# Patient Record
Sex: Male | Born: 1995
Health system: Western US, Academic
[De-identification: ages and names within clinical notes are randomized; demographics above are authoritative.]

## PROBLEM LIST (undated history)

## (undated) DIAGNOSIS — S060XAA Concussion with loss of consciousness status unknown, initial encounter: Secondary | ICD-10-CM

## (undated) DIAGNOSIS — S060X9A Concussion with loss of consciousness of unspecified duration, initial encounter: Secondary | ICD-10-CM

## (undated) DIAGNOSIS — S62101A Fracture of unspecified carpal bone, right wrist, initial encounter for closed fracture: Secondary | ICD-10-CM

## (undated) HISTORY — PX: TYMPANOSTOMY TUBE PLACEMENT: SHX32

## (undated) HISTORY — DX: Concussion with loss of consciousness of unspecified duration, initial encounter: S06.0X9A

## (undated) HISTORY — PX: WRIST SURGERY: SHX841

## (undated) HISTORY — DX: Concussion with loss of consciousness status unknown, initial encounter: S06.0XAA

## (undated) HISTORY — DX: Fracture of unspecified carpal bone, right wrist, initial encounter for closed fracture: S62.101A

---
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
## 2019-08-05 ENCOUNTER — Telehealth: Payer: Self-pay | Admitting: Unknown Physician Specialty

## 2019-08-05 NOTE — Telephone Encounter (Signed)
No one is on pt's DPR called pt no answer left vm to call back    Copied from CRM [id number]. Topic: General - Inquiry >> Aug 05, 2019 11:36 AM Leafy Ro wrote: Reason for CRM:  Pt mom is calling and would like a copy of her son immunization records. Please call mom when ready for pick up

## 2019-08-07 NOTE — Telephone Encounter (Signed)
Called PT to confirm due to mother not on DPR Pt verbalized and gave authorization for mother to pick up documentation Pt verbalized understanding.Immunization record  was printed and given sealed to mother in a envelope per pt verbal authorization.

## 2021-01-01 ENCOUNTER — Ambulatory Visit: Payer: PRIVATE HEALTH INSURANCE

## 2021-01-01 DIAGNOSIS — Z021 Encounter for pre-employment examination: Secondary | ICD-10-CM

## 2021-01-01 DIAGNOSIS — Z025 Encounter for examination for participation in sport: Secondary | ICD-10-CM

## 2021-01-01 DIAGNOSIS — Z136 Encounter for screening for cardiovascular disorders: Secondary | ICD-10-CM

## 2021-01-02 ENCOUNTER — Inpatient Hospital Stay: Payer: PRIVATE HEALTH INSURANCE | Attending: Pediatric Cardiology

## 2021-01-02 ENCOUNTER — Inpatient Hospital Stay: Payer: PRIVATE HEALTH INSURANCE

## 2021-01-02 ENCOUNTER — Ambulatory Visit: Payer: PRIVATE HEALTH INSURANCE

## 2021-01-02 ENCOUNTER — Ambulatory Visit: Payer: PRIVATE HEALTH INSURANCE | Attending: Sports Medicine

## 2021-01-02 DIAGNOSIS — Z136 Encounter for screening for cardiovascular disorders: Secondary | ICD-10-CM

## 2021-01-02 DIAGNOSIS — Z025 Encounter for examination for participation in sport: Secondary | ICD-10-CM

## 2021-01-02 DIAGNOSIS — Z021 Encounter for pre-employment examination: Secondary | ICD-10-CM

## 2021-01-02 NOTE — H&P
[facility] - Sports Medicine Encounter  [facility]  [place]          PATIENT:  Vincent Allison  MRN:  [id number]  DOB:  January 06, 1996  DATE OF SERVICE:  01/02/2021    No chief complaint on file.      REFERRING: [employer]     REASON FOR REFERRAL/CONSULTATION: Medical screening/Pre-participation Evaluation (PPE)       SUBJECTIVE:      Antion Andres is a 25 y.o. male who is here for a screening Sports PPE.  He is here for a [employer].  All prior PPE's have been normal.  He has never been disqualified from sports participation.    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 Previously played 4  Years at [employer].    Had COVID 2020 - mild symptoms  J&J: 08/05/19  Pfizer booster: 03/03/20    Has chipped left upper molar - was evaluated by dentist who did not feel any intervention was required.      Sport: Biochemist, clinical  He feels well and has no complaints.    Past Medical History: No past medical history on file.     Past Surgical History: No past surgical history on file.    Current Medications:   No current outpatient medications on file.     No current facility-administered medications for this visit.       Allergies: Patient has no allergy information on record.    Family History:   No family status information on file.     No family history on file.  No known family history of sudden cardiac or unexplained death, structural heart disease (hypertrophic or dilated cardiomyopathy, Marfan syndrome or connective tissue disease, long QT syndrome or other channelopathies and clinically important arrhythmias.    Social History  Tobacco/alcohol/drugs: denies   Sexual activity: discussed   Diet: No restrictions   Exercise: Daily   Sleep: Without issue   Emotional well being: no concerns, denies SI/HI, negative depression screen   Safety: Uses appropriate protective equipment. Wears seat belt in vehicles.     Supplements   Denies OTC herbal supplements, stimulants, or steroids.    Review of Systems   General: negative for chills, fatigue, fever, malaise, weight gain and weight loss   CVS: negative for chest pain, dyspnea on exertion, loss of consciousness, murmur, orthopnea, palpitations, shortness of breath, presyncope and previous diagnosis of hypertension   Respiratory: negative for cough, pleuritic pain, shortness of breath, wheezing and previous diagnosis of asthma   GU: negative for hernias and pelvic pain   Neurologic: negative for confusion, dizziness, gait disturbance, headaches, impaired coordination/balance, numbness/tingling, seizures, speech problems, visual changes and recent or previous concussions   Musculoskeletal: negative for joint pain, joint swelling, muscle pain, muscular weakness, chronic muscle or joint pain and recent fractures or dislocations   Additional review of 10 systems was otherwise negative (See NBA player questionnaire).      OBJECTIVE:      BP 115/60  ~ Pulse (!) 48  ~ Temp 36.4 ?C (97.5 ?F)  ~ Ht 6' 6.5'' (1.994 m)  ~ Wt (!) 223 lb (101.2 kg)  ~ BMI 25.44 kg/m?  Body mass index is 25.44 kg/m?Marland Kitchen       Visual Acuity Screening    Right eye Left eye Both eyes   Without correction:      With correction: 20//20 20/20 20/15  General appearance: alert, appears stated age, NAD  Head:  normocephalic, atraumatic, without obvious abnormality  Eyes:  lids and conjunctiva grossly normal. PERRL, EOMI  ENMT: external ears without abnormality, dentition good, MMM  Neck: midline trachea, no rigidity, no thyromegaly, no LAD  Cardiovascular;  RRR, normal s1 and s2, no m/r/g.  Respiratory: CTABL, good respiratory effort, no w/r/r  GI:  BS+, soft, NTND, no masses or organomegaly.  Musculoskeletal exam: Normal range of motion. He exhibits no edema, extremities WWP  Neurological exam:  CN II-XII grossly intact, sensation to light touch intact, strength 5/5 in BUE and BLE, finger-to-nose and rapid alternating movements intact, ambulating without difficulty  Skin: skin color grossly normal, no rashes or lesions on inspection or palpation   Psychiatric:  A&O, mood and affect appropriate       ASSESSMENT:    Chanceler Pullin is a 25 y.o. male who presents for preparticipation physical evaluation and clearance.     In addition to this note, I have reviewed the [facility] pre-participation questionnaire which was completed by Jones Skene.  Pertinent positives have been noted.    Ryelan Kazee is found to be in excellent medical health.   PLAN:    1. Sports physical  2. Physical exam, pre-employment    [employer]BA [employer] Sports Physical - Labs ordered:   - Lipid Panel  - Hgba1c  - CBC  - Ferritin  - CMP  - GGT  - Uric Acid  - Vitamin D, 25-Hydroxy  - TSH  - MTB-Quantiferon-Gold ELISA  - Sickle Cell Prep  - Hep B sAg  - Hep B sAb-Quant  - Hep B core Igm  - HCV screen  - Measles titer  - Mumps titer  - Rubella titer  - Varicella titer  - Urinalysis  - Phosphorus    Concussion Baseline test completed.  See scanned document in [facility].    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      Roetta Sessions. Angelena Sole, MD  Primary Care Sports Medicine  Division of Sports Medicine & Non-Operative Orthopedics  [facility]  Associate Head Team Physician and Director of Primary Care - [facility] [place]

## 2021-01-02 NOTE — Consults
PATIENT: Vincent Allison  MRN: [id number]  DOB: 01/05/1996  DATE OF SERVICE: 01/02/2021    REFERRING: [facility] [employer]  PRIMARY CARE PROVIDER: Dagoberto Reef M.D.    SPECIALTY: Cardiology    REASON FOR REFERRAL/CONSULTATION: Cardiac screening per [facility] protocol.    Subjective:   History was provided by the patient .  Vincent Allison is a 25 y.o. male who was seen in clinic for pre-participation cardiac screening. Screening form was negative for family history of cardiac disease or symptoms of cardiac disease.    The following items in the patient's chart were reviewed: allergies, current medications, past family history, past medical history, past social history, past surgical history and problem list. The patient states he has completed the NBA screening questionnaire and had no positive findings other than what is noted below. The cardiovascular questions were reviewed with the patient and he specifically denied syncope, near syncope, chest pain with exercise, palpitations, prior cardiac diagnosis or exercise induced symptoms. He also denied any family history of sudden death, unusual accidental deaths, early cardiovascular disease or diabetes.    Review of Systems:  Pertinent items are noted in HPI    Objective:     Vitals:  There were no vitals taken for this visit. No height and weight on file for this encounter. Facility age limit for growth percentiles is 20 years. Facility age limit for growth percentiles is 20 years.    General:   alert and no distress   Skin:   warm and dry, no hyperpigmentation, vitiligo, or suspicious lesions   Nodes:   cervical, supraclavicular, and axillary nodes normal.   Eyes:   No conjunctival injection, sclerae anicteric   Oropharynx:  lips, mucosa, and tongue normal; teeth and gums normal   Neck:  no adenopathy, no carotid bruit, no JVD, supple, symmetrical, trachea midline and thyroid not enlarged, symmetric, no tenderness/mass/nodules   Lungs:   clear to auscultation bilaterally   Heart: regular rate and rhythm, S1, S2 normal, no murmur, click, rub or gallop   Abdomen:   soft, non-tender; bowel sounds normal; no masses,  no organomegaly   Extremities:  extremities normal, atraumatic, no cyanosis or edema   Psychiatric:   normal mood, behavior, speech, dress, and thought processes     Stress Echocardiogram: Normal/ Pass    Echocardiogram: Normal/ Pass    ECG: Normal/ Pass    Assessment:     Healthy Athlete.    Plan/ Recommendation:       Cleared to participate in professional athletics.    Attending: Vania Rea. Carollee Herter 01/02/2021 2:54 PM       NBA Marfan Syndrome Focused Examination      Name:___________[name]________________________ DOB:_________[birth year]_________________  If potential incidence of Marfan Syndrome is suspected based on the results of the [facility] Cardiovascular Screening Medical History Questionnaire, and the player has not previously undergone the NBA Marfan Syndrome Focused Examination, the physician must evaluate the player for the criteria below and discuss the results with the [place] cardiac consultants designated at the bottom of this form.  The physician and player are not required to review or complete this focused examination if the player has previously completed it.  A. Marfan Criteria ?   Wrist AND/OR Thumb Sign1  []  Yes [x]  No   Pectus Carinatum or Excavatum Deformity []  Yes [x]  No   Scoliosis or Thoracolumbar Kyphosis []  Yes [x]  No   Reduced Elbow Extension []  Yes [x]  No   Skin Striae []  Yes [x]  No  Hindfoot Deformity []  Yes [x]  No   Plain Flat Foot []  Yes [x]  No   Facial Features22 []  Yes [x]  No   Severe Myopia (>3 diopters) []  Yes [x]  No   Spontaneous Pneumothorax []  Yes [x]  No  B. Additional Medical Notes by Physician (if needed)             Following this focused examination, the physician must discuss the results of this evaluation with a [employer] cardiac consultant: attn. Garth Bigness - [phone] (o) ~ [email or web address] ~ [phone] (m); or if Dr. Rennis Chris is unavailable: Newman Nickels - [phone] (o) ~ [email or web address] ~ [phone] (m).    ? Loeys BL, Reginia Forts HC, Braverman AC, et al; The revised [place] nosology for the Marfan Syndrome. J Med Genet. 2010 Jul;47(7):476-85. Doi: [id number]    1 Please be advised that the thumb sign is positive when the entire distal phalanx of the adducted thumb extends beyond the ulnar border of the palm with or without the assistance of the patient or examiner to achieve maximal adduction.  The wrist sign is positive when the tip of the thumb covers the entire fingernail of the fifth finger when wrapped around the contralateral wrist.    2 The physician should evaluate the player for these typical facial characteristics: dolichocephaly (disproportionately long and narrow head); downward slanting palpebral fissures (slanting of space between eyelids); enophthalmos (recession of eyeball within orbit); retrognathia (recession of either or both jaws); and malar hypoplasia (underdeveloped cheekbones).

## 2021-01-03 NOTE — Progress Notes
ORTHOPAEDIC SURGERY CONSULT NOTE    Vincent Allison   DOB: Nov 26, 1995   MRN: [id number]   Date of Service: 01/02/2021     CHIEF COMPLAINT:  Pre-participation physical    HPI: 25 y.o. 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 ***He denies any musculoskeletal complaints at this time.    ***He does report a history of *** injury in the past (month and year) which was treated with ***.    ***His time away from basketball competition in the past due to injury includes ***months following a ***    ***I have reviewed with the patient the [facility] pre-participation questionnaire which was completed by the patient.      PAST MEDICAL HISTORY:   No past medical history on file.    PAST SURGICAL HISTORY:  No past surgical history on file.    MEDICATIONS:   No current outpatient medications on file.     No current facility-administered medications for this visit.       ALLERGIES:  Patient has no allergy information on record.    PHYSICAL EXAM:   There were no vitals filed for this visit.  BMI: There is no height or weight on file to calculate BMI.  GENERAL: Well-developed, well nourished, no acute distress  SKIN: intact, no erythema or ecchymosis  SPINE: ***Full symmetric flexion, axial rotation and lateral bending of the lumbar and cervical spine without issue      KNEE EXAM:   Left  Right   Standing alignment {Neutral/varus:32217::''neutral''} {Neutral/varus:32217::''neutral''}   Flexion {NUMBERS; 0-180 BY 10:15935::''140''} {NUMBERS; 0-180 BY 10:15935::''140''}   Extension {Rom -50-20:140019::''0''} {Rom -50-20:140019::''0''}   Effusion {MILD, MOD, SEV:29846::''none''} {MILD, MOD, SEV:29846::''none''}       Special tests:   Left Right   Joint line Tenderness {Anatomy; malleolus lateral/medial:19558::''medial and lateral'',''none''}   {Anatomy; malleolus lateral/medial:19558::''medial and lateral'',''none''}     Patellar facet tenderness {ADVANCED DIRECTIVE NO:20005::''no''} {ADVANCED DIRECTIVE NO:20005::''no''}   McMurray's {pos/neg/not done:321853::''negative''} {pos/neg/not done:321853::''negative''}   Lachman {grade:317898::''guarded'',''stable''} {grade:317898::''guarded'',''stable''}   Posterior Drawer  (station of MFC) {grade:317898::''guarded'',''stable''} {grade:317898::''guarded'',''stable''}   Valgus stress at 0deg {grade:317898::''guarded'',''stable''} {grade:317898::''guarded'',''stable''}   Varus stress at 0deg {grade:317898::''guarded'',''stable''} {grade:317898::''guarded'',''stable''}   Valgus stress at 30deg {grade:317898::''guarded'',''stable''} {grade:317898::''guarded'',''stable''}   Varus stress at 30deg {grade:317898::''guarded'',''stable''} {grade:317898::''guarded'',''stable''}   Thessaly's {pos/neg/not done:321853::''negative''} {pos/neg/not done:321853::''negative''}       HIP EXAM:  ***Symmetric internal rotation and external rotation with hips flexed to 90 degrees   ***No pain with with abduction and external rotation of the bilateral hips  ***No irritability of hip with flexion, internal rotation and adduction at 90deg flexion bilaterally    Additional lower extremity tests:  ***He can squat like a baseball catcher and take steps in this position without pain  ***He can hop 5 times on each ankle without issue  ***Symmetric ankle dorsiflexion and plantar flexion with range of motion within normal limits bilaterally  ***No TTP at the medial or lateral malleolus  ***No pain with syndesmosis squeeze, hindfoot squeeze or forefoot squeeze  ***No TTP at ATFL, CFL, PTFL or deltoid ligaments  ***Symmetric anterior drawer testing of bilat ankles  ***No TTP at the base of the 5th metatarsal bilaterally  ***No TTP at the patellar tendons or quadriceps tendons bilatearally  ***incisions (***# and size?) well healed at the***    Neurovascular lower extremity exam:  Neurologic:               Motor: EHL, TA, G/S intact at the bilateral lower extremities  Sensory: Sensation to light touch intact throughout Sural, Saph, DPN, SPN and tibial nerve distributions at the bilateral lower extremities   Vascular: palpable dorsalis pedis pulse at the bilateral lower extremities         UPPER EXTREMITY EXAM:   Left Right   Spurlings {pos/neg/not done:321853::''negative''} {pos/neg/not done:321853::''negative''}   AC joint TTP {pos/neg/not done:321853::''negative''} {pos/neg/not done:321853::''negative''}   Biceps TTP {pos/neg/not done:321853::''negative''} {pos/neg/not done:321853::''negative''}   Neer {pos/neg/not done:321853::''negative''} {pos/neg/not done:321853::''negative''}   Hawkings {pos/neg/not done:321853::''negative''} {pos/neg/not done:321853::''negative''}       ROM:   Left Right   Forward flexion  AROM: {NUMBERS; 0-180 BY 10:15935::''175''}  PROM: {NUMBERS; 0-180 BY 10:15935::''175''} AROM: {NUMBERS; 0-180 BY 10:15935::''175''}  PROM: {NUMBERS; 0-180 BY 10:15935::''175''}   ER at 90 (PROM) {NUMBERS; 0-180 BY 10:15935::''90''} {NUMBERS; 0-180 BY 10:15935::''90''}   IR at 90 (PROM) {NUMBERS; 0-180 BY 10:15935::''20''} {NUMBERS; 0-180 BY 10:15935::''20''}   ER at 0 (PROM) {NUMBERS; 0-180 BY 10:15935::''30''} {NUMBERS; 0-180 BY 10:15935::''30''}     Rotator cuff:   Left Right   Deltoid {NUMBERS; 1-5 (OUT OF 5):31831::''5/5''} {NUMBERS; 1-5 (OUT OF 5):31831::''5/5''}   Supraspinatus strength (Jobe's/empty can) {NUMBERS; 1-5 (OUT OF 5):31831::''5/5''} {NUMBERS; 1-5 (OUT OF 5):31831::''5/5''}   Infraspinatus strength {NUMBERS; 1-5 (OUT OF 5):31831::''5/5''} {NUMBERS; 1-5 (OUT OF 5):31831::''5/5''}   Belly press {NUMBERS; 1-5 (OUT OF 5):31831::''5/5''} {NUMBERS; 1-5 (OUT OF 5):31831::''5/5''}     ***Neg anterior apprehension and posterior apprehension bilateral upper extremities  ***Full symmetric ROM and strength at bilateral elbows  ***Normal symmetric grip strength bilateral upper extremities  ***No angular or rotational deformities at all fingers of the bilateral hands    Neurovascular upper extremity exam:  Vascular: palpable 2+ radial pulse at the bilateral upper extremities  Neurologic:  Motor: Finger flexion, finger extension, thumb extension, interossei function intact and the patient is able to make OK symbol at the bilateral upper extremities  Sensory: sensation to light touch intact at the Radial, Ulnar, Median and Axillary nerve distributions at the bilateral upper extremities    IMAGING:  XR hip ap+lat bilat w pelvis (5 views) Result Date: 01/02/2021  XR HIP AP LAT BILAT W AP PELVIS 5V INDICATION: NBA Athlete, Routine sports physical exam COMPARISON: None. FINDINGS: No acute or healing fracture. The hips demonstrate anatomic alignment. The joint spaces are preserved. Bilaterally, there is mild focal broadening at the anterolateral femoral head-neck junction (mild cam-type morphology). No calcific tendinitis. Sacroiliac joints and pubic symphysis are maintained. The imaged spine is unremarkable. Nonobstructive bowel gas pattern.   IMPRESSION: No evidence of recent trauma or arthritis. I, Maryellen Pile, M.D., have reviewed this radiological study personally, and I am in full agreement with the findings of the report presented above. Signed by: Maryellen Pile   01/02/2021 9:26 AM    XR ankle ap+lat+mortise standing bilat (3 views ea) Result Date: 01/02/2021  XR ANKLE AP LAT MORTISE STANDING BILAT 3V INDICATION: NBA Athlete, Routine sports physical exam COMPARISON: None. FINDINGS: No acute or healing fracture on either side. The joint spaces are maintained, with normal alignment. No ankle joint effusion on either side. No significant soft tissue swelling.   IMPRESSION: No evidence of recent trauma or arthritis. I, Maryellen Pile, M.D., have reviewed this radiological study personally, and I am in full agreement with the findings of the report presented above. Signed by: Maryellen Pile   01/02/2021 9:22 AM    XR knee ap+lat+tunnel+merchant standing bilat (4 views ea) Result Date: 01/02/2021  XR KNEE AP LAT TUNNEL MERCHANT STANDING BILAT 4V INDICATION: NBA Athlete,  Routine sports physical exam COMPARISON: None. FINDINGS: No acute or healing fracture on either side. The spaces are maintained, with normal alignment bilaterally. No joint effusion or chondrocalcinosis.   IMPRESSION: Unremarkable knee radiographs. I, Maryellen Pile, M.D., have reviewed this radiological study personally, and I am in full agreement with the findings of the report presented above. Signed by: Maryellen Pile   01/02/2021 9:28 AM      IMPRESSION:  25 y.o. male with full strength and range of motion without any pain on exam today throughout the body including the spine, bilateral upper and bilateral lower extremities.     ***He has no imaging findings that would preclude participation in basketball activities at this time.    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      PLAN:   I discussed with the patient that he has no significant orthopaedic pathology on exam or on imaging at this time that would preclude or restrict his participation in basketball activities.     ***He is recommended for full participation without restrictions from an orthopaedic standpoint.     All of his questions were answered.      The patient was instructed to follow up as needed.    Time of note may not reflect time of encounter.  Signed: Jacalyn Lefevre 01/02/2021
